# Patient Record
Sex: Female | Born: 1990 | Hispanic: Yes | Marital: Single | Smoking: Never smoker
Health system: Southern US, Community
[De-identification: ages and names within clinical notes are randomized; demographics above are authoritative.]

---

## 2019-05-05 ENCOUNTER — Emergency Department: Payer: Self-pay

## 2019-05-05 ENCOUNTER — Other Ambulatory Visit: Payer: Self-pay

## 2019-05-05 ENCOUNTER — Emergency Department
Admission: EM | Admit: 2019-05-05 | Discharge: 2019-05-05 | Disposition: A | Discharge status: Home / Self Care | Payer: Self-pay | Attending: Student in an Organized Health Care Education/Training Program | Admitting: Student in an Organized Health Care Education/Training Program

## 2019-05-05 DIAGNOSIS — R55 Syncope and collapse: Secondary | ICD-10-CM | POA: Insufficient documentation

## 2019-05-05 LAB — CBC WITH DIFFERENTIAL/PLATELET
Abs Immature Granulocytes: 0.01 10*3/uL (ref 0.00–0.07)
Basophils Absolute: 0.1 10*3/uL (ref 0.0–0.1)
Basophils Relative: 1 %
Eosinophils Absolute: 0.1 10*3/uL (ref 0.0–0.5)
Eosinophils Relative: 2 %
HCT: 41.3 % (ref 36.0–46.0)
Hemoglobin: 13.6 g/dL (ref 12.0–15.0)
Immature Granulocytes: 0 %
Lymphocytes Relative: 20 %
Lymphs Abs: 1.6 10*3/uL (ref 0.7–4.0)
MCH: 28.8 pg (ref 26.0–34.0)
MCHC: 32.9 g/dL (ref 30.0–36.0)
MCV: 87.3 fL (ref 80.0–100.0)
Monocytes Absolute: 0.5 10*3/uL (ref 0.1–1.0)
Monocytes Relative: 6 %
Neutro Abs: 5.7 10*3/uL (ref 1.7–7.7)
Neutrophils Relative %: 71 %
Platelets: 246 10*3/uL (ref 150–400)
RBC: 4.73 MIL/uL (ref 3.87–5.11)
RDW: 13.3 % (ref 11.5–15.5)
WBC: 8 10*3/uL (ref 4.0–10.5)
nRBC: 0 % (ref 0.0–0.2)

## 2019-05-05 LAB — COMPREHENSIVE METABOLIC PANEL
ALT: 14 U/L (ref 0–44)
AST: 19 U/L (ref 15–41)
Albumin: 4 g/dL (ref 3.5–5.0)
Alkaline Phosphatase: 41 U/L (ref 38–126)
Anion gap: 6 (ref 5–15)
BUN: 16 mg/dL (ref 6–20)
CO2: 26 mmol/L (ref 22–32)
Calcium: 8.9 mg/dL (ref 8.9–10.3)
Chloride: 105 mmol/L (ref 98–111)
Creatinine, Ser: 0.6 mg/dL (ref 0.44–1.00)
GFR calc Af Amer: >60 mL/min (ref >60)
GFR calc non Af Amer: >60 mL/min (ref >60)
Glucose, Bld: 111 mg/dL — ABNORMAL HIGH (ref 70–99)
Potassium: 4 mmol/L (ref 3.5–5.1)
Sodium: 137 mmol/L (ref 135–145)
Total Bilirubin: 0.7 mg/dL (ref 0.3–1.2)
Total Protein: 7.2 g/dL (ref 6.5–8.1)

## 2019-05-05 LAB — GLUCOSE, CAPILLARY: Glucose-Capillary: 111 mg/dL — ABNORMAL HIGH (ref 70–99)

## 2019-05-05 MED ORDER — SODIUM CHLORIDE 0.9 % IV BOLUS
1000.0000 mL | Freq: Once | INTRAVENOUS | Status: AC
  Administered 2019-05-05: 1000 mL via INTRAVENOUS

## 2019-05-05 NOTE — Discharge Instructions (Signed)
Do not operate heavy machinery or operate a vehicle until you have been seen and cleared to do so by PCP or neurology.

## 2019-05-05 NOTE — ED Notes (Signed)
POC Urine pregnancy is Negative. Unable to upload results due to barcodes not scanning.

## 2019-05-05 NOTE — ED Notes (Signed)
Pt states coming in after she had LOC at 05:30. Pt states some pain to the face. Pt is alert and oriented x4. Pt states history of syncopal episodes.

## 2019-05-05 NOTE — ED Notes (Signed)
Pt reports that syncopal episodes always occur first thing in the morning and never during the day. No hx of diabetes. Pt states this morning she got dizzy, nauseated, sat down and woke up on the floor. Pt has a red abrasion to the left forehead and the bridge of the nose from the fall.

## 2019-05-05 NOTE — ED Provider Notes (Signed)
[facility] Emergency Department Provider Note    First MD Initiated Contact with Patient 05/05/19 1730     (approximate)  I have reviewed the triage vital signs and the nursing notes.   HISTORY  Chief Complaint Loss of Consciousness    HPI Loretta Roberts is a 29 y.o. female   with a history of fainting spells presents to the ER from urgent care via EMS due to fainting spell that occurred this morning.  States that she was getting up out of bed and started feeling some nausea and was feeling lightheaded.  She then stood up quickly out of bed started seeing spots and then as if her vision was becoming tunnel vision.  She then fell to the ground losing consciousness briefly.  She woke up shortly thereafter.  Did realize that she had lost control of her bladder.  Did not bite her tongue.  Was not complaining of any muscle pain or aches.  Did have some contusion to her nose.  States that similar stuff has happened in the past.  [place] so has not established with any providers.  Denies any history of seizure disorder.   History reviewed. No pertinent past medical history. No family history on file. History reviewed. No pertinent surgical history. There are no problems to display for this patient.     Prior to Admission medications   Not on File    Allergies Patient has no allergy information on record.    Social History Social History   Tobacco Use  . Smoking status: Never Smoker  . Smokeless tobacco: Never Used  Substance Use Topics  . Alcohol use: Not Currently  . Drug use: Never    Review of Systems Patient denies headaches, rhinorrhea, blurry vision, numbness, shortness of breath, chest pain, edema, cough, abdominal pain, nausea, vomiting, diarrhea, dysuria, fevers, rashes or hallucinations unless otherwise stated above in HPI. ____________________________________________   PHYSICAL EXAM:  VITAL  SIGNS: Vitals:   05/05/19 1800 05/05/19 1940  BP: 111/71 110/72  Pulse: 75 77  Resp: 13 19  Temp:    SpO2: 100% 100%    Constitutional: Alert and oriented.  Eyes: Conjunctivae are normal.  Head: Superficial abrasion to left forehead.  Does have some ecchymosis over the bridge of the nose.  No deformity.  No septal hematoma no hemotympanum Nose: No congestion/rhinnorhea. Mouth/Throat: Mucous membranes are moist.   Neck: No stridor. Painless ROM.  Cardiovascular: Normal rate, regular rhythm. Grossly normal heart sounds.  Good peripheral circulation. Respiratory: Normal respiratory effort.  No retractions. Lungs CTAB. Gastrointestinal: Soft and nontender. No distention. No abdominal bruits. No CVA tenderness. Genitourinary:  Musculoskeletal: No lower extremity tenderness nor edema.  No joint effusions. Neurologic: CN- intact.  No facial droop, Normal FNF.  Normal heel to shin.  Sensation intact bilaterally. Normal speech and language. No gross focal neurologic deficits are appreciated. No gait instability. Skin:  Skin is warm, dry and intact. No rash noted. Psychiatric: Mood and affect are normal. Speech and behavior are normal.  ____________________________________________   LABS (all labs ordered are listed, but only abnormal results are displayed)  Results for orders placed or performed during the hospital encounter of 05/05/19 (from the past 24 hour(s))  CBC with Differential/Platelet     Status: None   Collection Time: 05/05/19  5:21 PM  Result Value Ref Range   WBC 8.0 4.0 - 10.5 K/uL   RBC 4.73 3.87 - 5.11 MIL/uL  Hemoglobin 13.6 12.0 - 15.0 g/dL   HCT 16.1 09.6 - 04.5 %   MCV 87.3 80.0 - 100.0 fL   MCH 28.8 26.0 - 34.0 pg   MCHC 32.9 30.0 - 36.0 g/dL   RDW 40.9 81.1 - 91.4 %   Platelets 246 150 - 400 K/uL   nRBC 0.0 0.0 - 0.2 %   Neutrophils Relative % 71 %   Neutro Abs 5.7 1.7 - 7.7 K/uL   Lymphocytes Relative 20 %   Lymphs Abs 1.6 0.7 - 4.0 K/uL   Monocytes  Relative 6 %   Monocytes Absolute 0.5 0.1 - 1.0 K/uL   Eosinophils Relative 2 %   Eosinophils Absolute 0.1 0.0 - 0.5 K/uL   Basophils Relative 1 %   Basophils Absolute 0.1 0.0 - 0.1 K/uL   Immature Granulocytes 0 %   Abs Immature Granulocytes 0.01 0.00 - 0.07 K/uL  Comprehensive metabolic panel     Status: Abnormal   Collection Time: 05/05/19  5:21 PM  Result Value Ref Range   Sodium 137 135 - 145 mmol/L   Potassium 4.0 3.5 - 5.1 mmol/L   Chloride 105 98 - 111 mmol/L   CO2 26 22 - 32 mmol/L   Glucose, Bld 111 (H) 70 - 99 mg/dL   BUN 16 6 - 20 mg/dL   Creatinine, Ser 7.82 0.44 - 1.00 mg/dL   Calcium 8.9 8.9 - 95.6 mg/dL   Total Protein 7.2 6.5 - 8.1 g/dL   Albumin 4.0 3.5 - 5.0 g/dL   AST 19 15 - 41 U/L   ALT 14 0 - 44 U/L   Alkaline Phosphatase 41 38 - 126 U/L   Total Bilirubin 0.7 0.3 - 1.2 mg/dL   GFR calc non Af Amer >60 >60 mL/min   GFR calc Af Amer >60 >60 mL/min   Anion gap 6 5 - 15  Glucose, capillary     Status: Abnormal   Collection Time: 05/05/19  5:27 PM  Result Value Ref Range   Glucose-Capillary 111 (H) 70 - 99 mg/dL   ____________________________________________  EKG My review and personal interpretation at Time: 17:19   Indication: syncope  Rate: 80  Rhythm: sinus Axis: normal Other: normal intervals, no stemi no wpw or brugada ____________________________________________  RADIOLOGY  I personally reviewed all radiographic images ordered to evaluate for the above acute complaints and reviewed radiology reports and findings.  These findings were personally discussed with the patient.  Please see medical record for radiology report.  ____________________________________________   PROCEDURES  Procedure(s) performed:  Procedures    Critical Care performed: no ____________________________________________   INITIAL IMPRESSION / ASSESSMENT AND PLAN / ED COURSE  Pertinent labs & imaging results that were available during my care of the patient were  reviewed by me and considered in my medical decision making (see chart for details).   DDX: Dysrhythmia, anemia, electrolyte abnormality, ACS, seizure, ectopic  Cniyah Sproull is a 29 y.o. who presents to the ED with symptoms as described above.  Patient is currently clinically very well-appearing in no acute distress after a syncopal episode that occurred this morning around 5:00.  Were now nearly 12 hours after the episode is brought in by EMS from urgent care.  Work-up initiated shows normal vital signs reassuring exam as well as reassuring imaging.  I will lower suspicion for seizure as she is reporting feeling nauseated and lightheaded prior to this event.  States that the symptoms occurred after she quickly stood up out of  bed where she then started seeing spots and then felt like her vision was developing tunnel vision.  She then woke up on the floor.  She was given IV fluids.  She is tolerating p.o.  She is not a diabetic.  Certainly no signs of status.  At this point believe she is cleared and appropriate for outpatient follow-up.     The patient was evaluated in Emergency Department today for the symptoms described in the history of present illness. He/she was evaluated in the context of the global COVID-19 pandemic, which necessitated consideration that the patient might be at risk for infection with the SARS-CoV-2 virus that causes COVID-19. Institutional protocols and algorithms that pertain to the evaluation of patients at risk for COVID-19 are in a state of rapid change based on information released by regulatory bodies including the CDC and federal and state organizations. These policies and algorithms were followed during the patient's care in the ED.  As part of my medical decision making, I reviewed the following data within the electronic MEDICAL RECORD NUMBER Nursing notes reviewed and incorporated, Labs reviewed, notes from prior ED visits and [place] Controlled Substance  Database   ____________________________________________   FINAL CLINICAL IMPRESSION(S) / ED DIAGNOSES  Final diagnoses:  Syncope and collapse      NEW MEDICATIONS STARTED DURING THIS VISIT:  New Prescriptions   No medications on file     Note:  This document was prepared using Dragon voice recognition software and may include unintentional dictation errors.    Willy Eddy, MD 05/05/19 [phone]

## 2019-05-05 NOTE — ED Triage Notes (Signed)
Pt to the er via EMS from [facility]. Pt had an episode this morning of LOC which is not uncommon for her however this morning she had loss of bladder which is not the norm. [facility] suspects a seizure. Pt has had no further incidents today. No hx of seizures. VSS with EMS.

## 2021-01-02 IMAGING — CT CT MAXILLOFACIAL W/O CM
3 of 4 series · 15 of 47 positions shown, 18 images · non-contrast
Comparison: None.

CLINICAL DATA: Recent syncopal episode with nasal injury, initial
encounter

EXAM:
CT HEAD WITHOUT CONTRAST
CT MAXILLOFACIAL WITHOUT CONTRAST
TECHNIQUE: Multidetector CT imaging of the head and maxillofacial structures
were performed using the standard protocol without intravenous
contrast. Multiplanar CT image reconstructions of the maxillofacial
structures were also generated.

[Series 2: max soft (person_name) · axial · 0.32mm/px · z∈[+262,+394]mm · 10 of 78 slices shown, 13 images]
[im 6/78  brain]
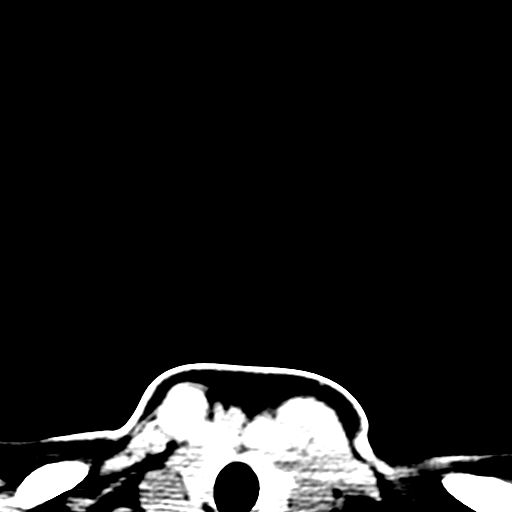
[im 6/78  bone]
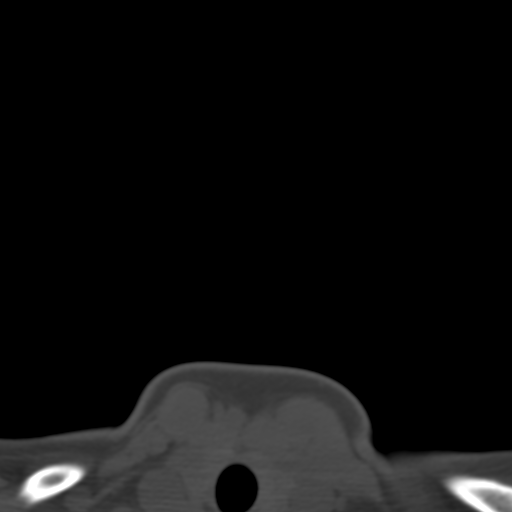
[im 14/78  bone]
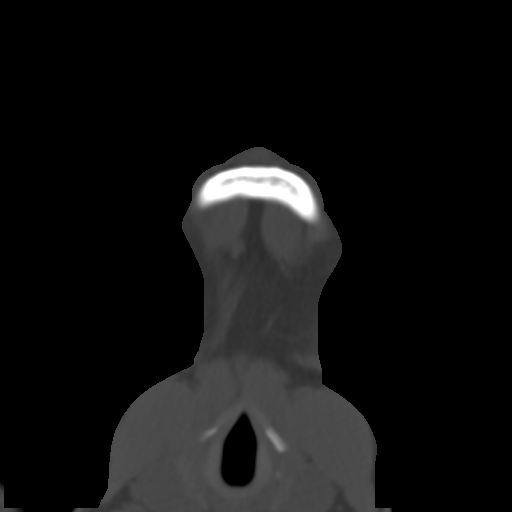
[im 22/78  bone]
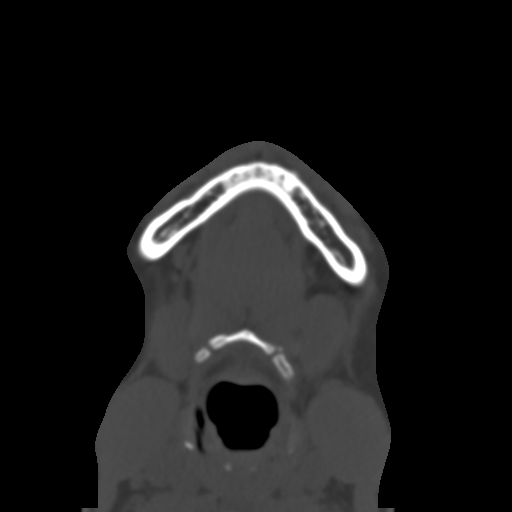
[im 27/78  bone]
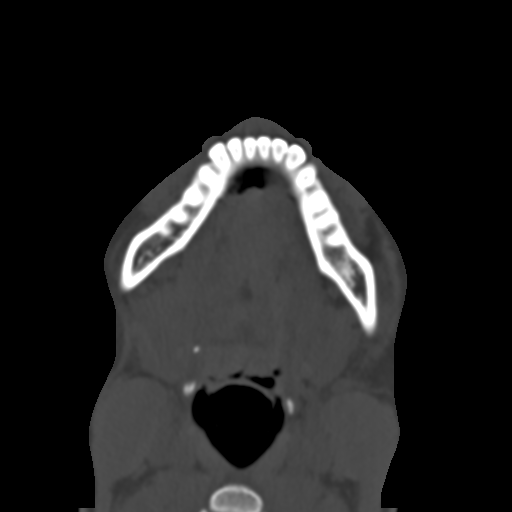
[im 35/78  brain]
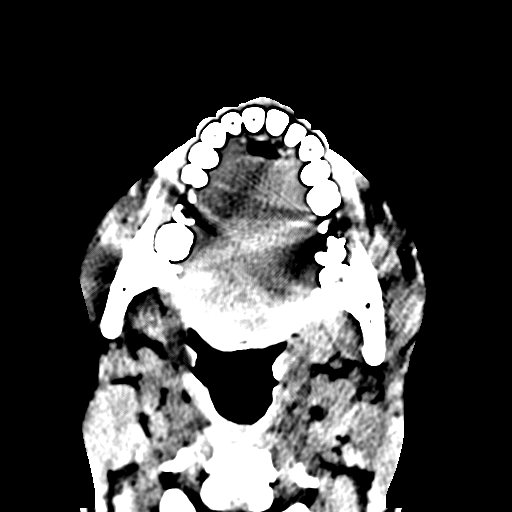
[im 35/78  bone]
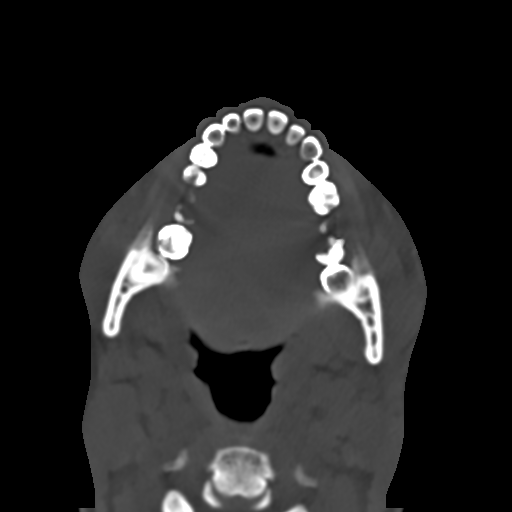
[im 43/78  bone]
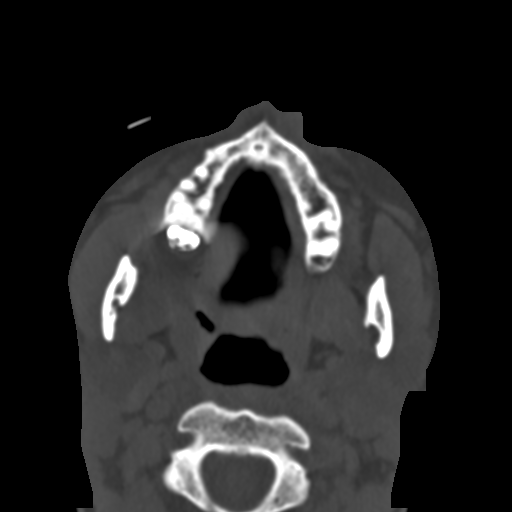
[im 51/78  bone]
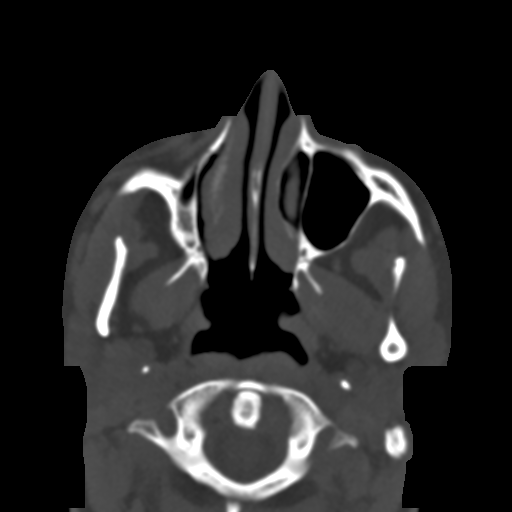
[im 59/78  bone]
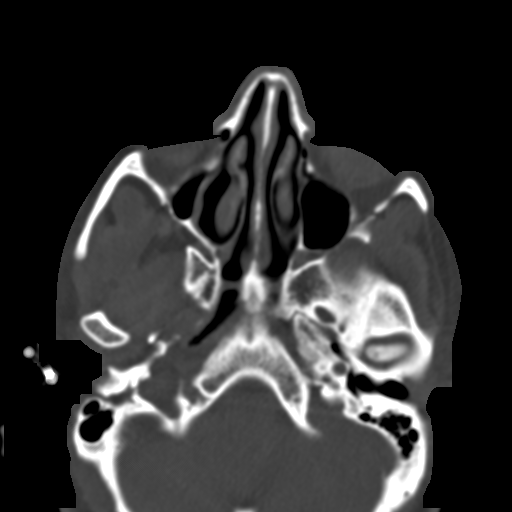
[im 64/78  brain]
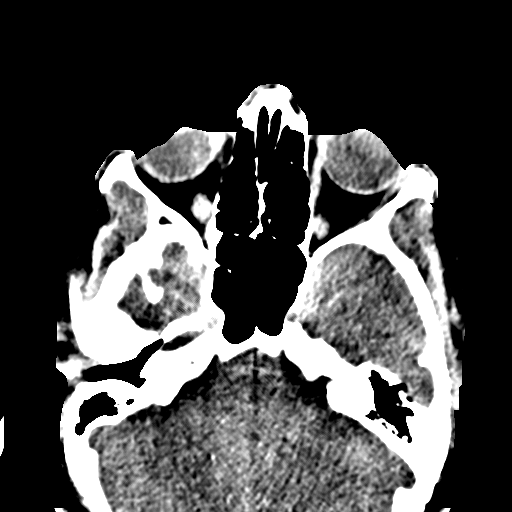
[im 64/78  bone]
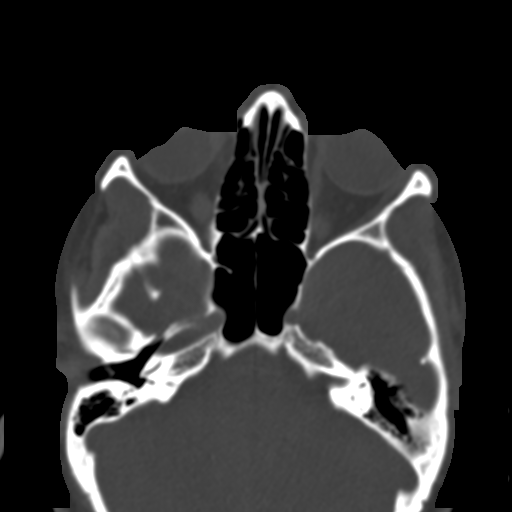
[im 72/78  bone]
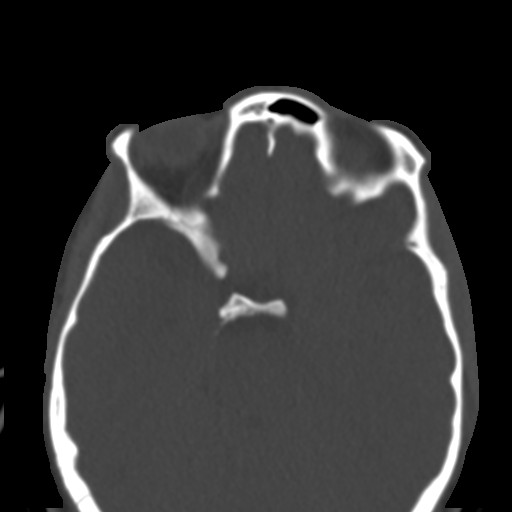

[Series 6: coronal soft · coronal · 0.30mm/px · 3 of 113 slices shown]
[im 38/113  bone]
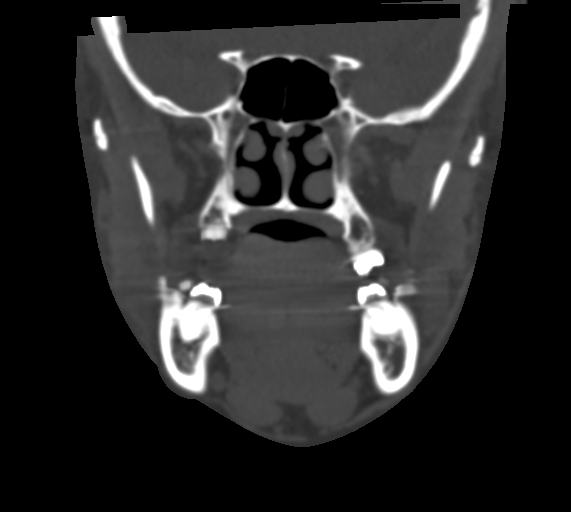
[im 50/113  bone]
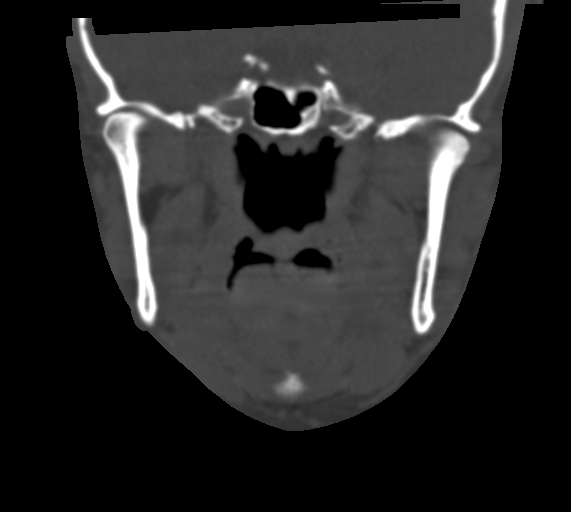
[im 63/113  bone]
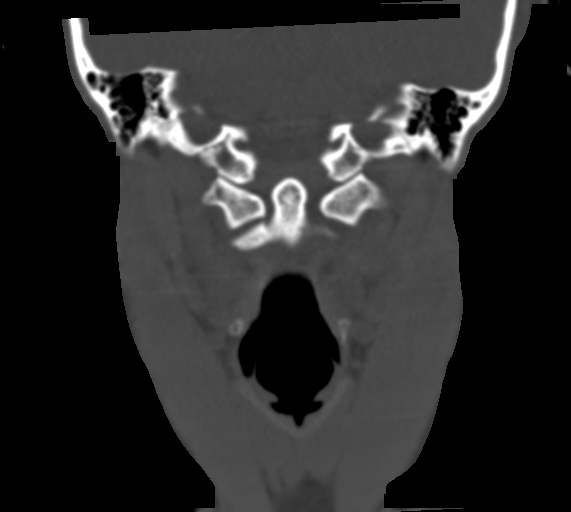

[Series 9: sagittal bone · sagittal · 0.30mm/px · 2 of 88 slices shown]
[im 30/88  bone]
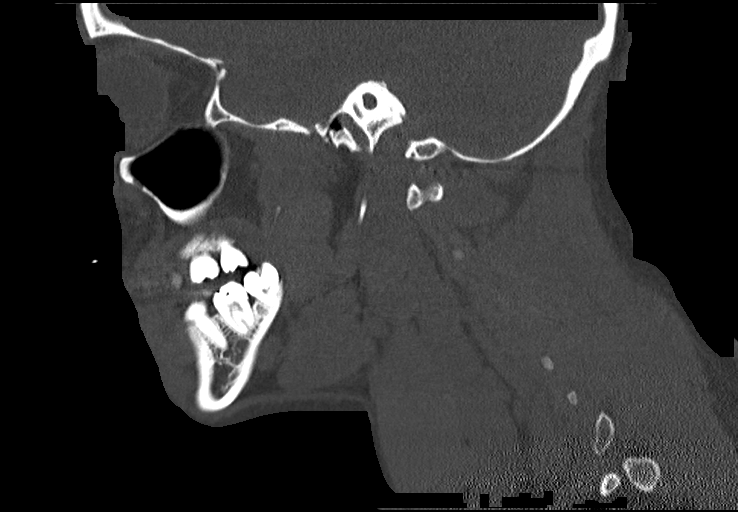
[im 59/88  bone]
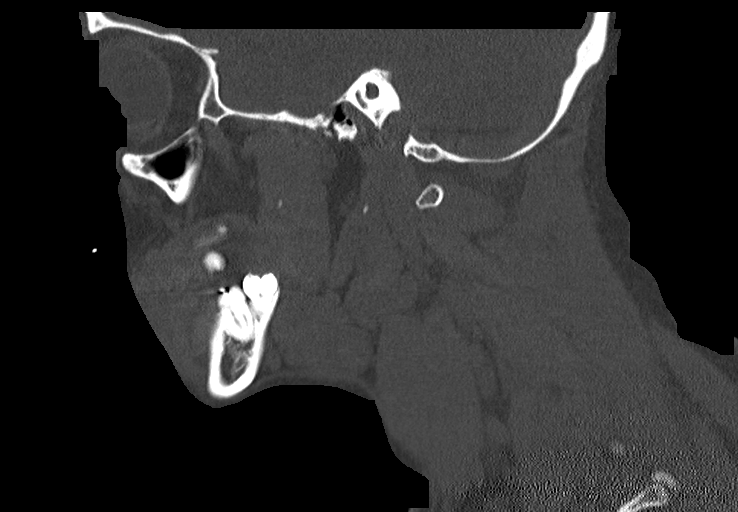

[15 of 47 positions shown; findings below may reference images not displayed]

FINDINGS: CT HEAD FINDINGS

Brain: No evidence of acute infarction, hemorrhage, hydrocephalus,
extra-axial collection or mass lesion/mass effect.

Vascular: No hyperdense vessel or unexpected calcification.

Skull: Normal. Negative for fracture or focal lesion.

Other: None.

CT MAXILLOFACIAL FINDINGS

Osseous: No acute fracture is identified.

Orbits: Negative. No traumatic or inflammatory finding.

Sinuses: Clear.

Soft tissues: No significant soft tissue abnormality is noted.
IMPRESSION: CT of the head: Normal head CT for age.

CT of the maxillofacial bones: No acute bony abnormality is noted.

## 2021-01-02 IMAGING — CT CT HEAD W/O CM
3 series · 16 of 47 positions shown, 19 images · non-contrast
Comparison: None.

CLINICAL DATA: Recent syncopal episode with nasal injury, initial
encounter

EXAM:
CT HEAD WITHOUT CONTRAST
CT MAXILLOFACIAL WITHOUT CONTRAST
TECHNIQUE: Multidetector CT imaging of the head and maxillofacial structures
were performed using the standard protocol without intravenous
contrast. Multiplanar CT image reconstructions of the maxillofacial
structures were also generated.

[Series 2: head wo · axial · 0.39mm/px · z∈[+381,+506]mm · 10 of 30 slices shown, 13 images]
[im 3/30  brain]
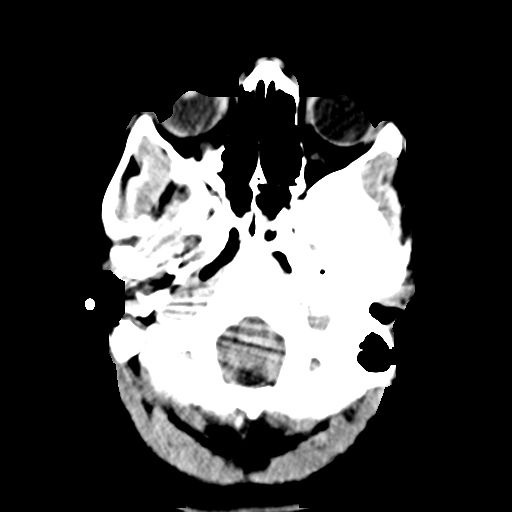
[im 3/30  bone]
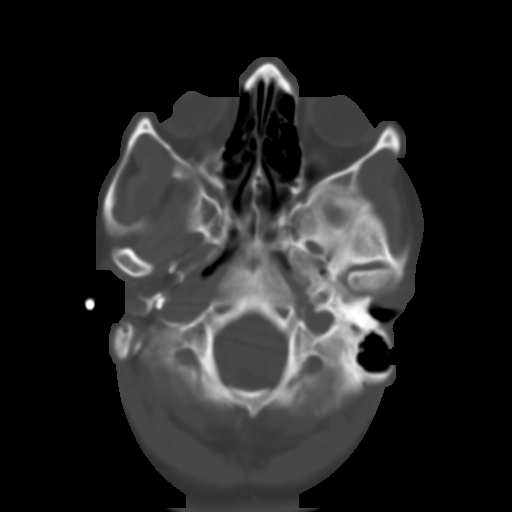
[im 6/30  brain]
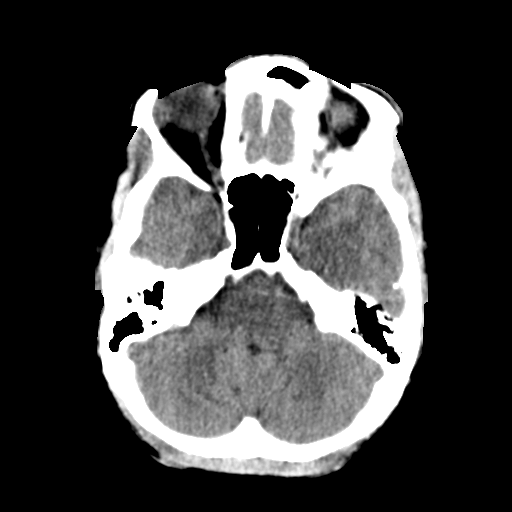
[im 9/30  brain]
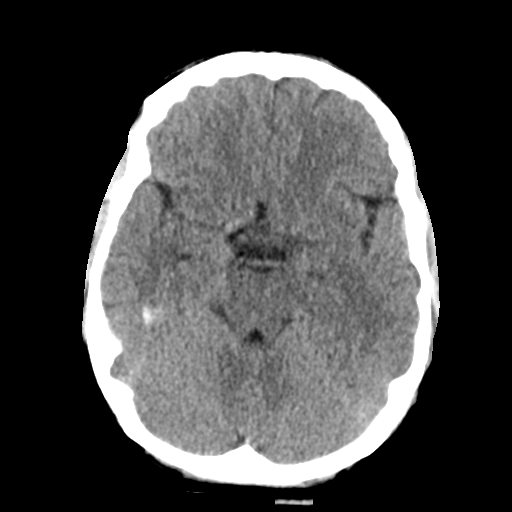
[im 11/30  brain]
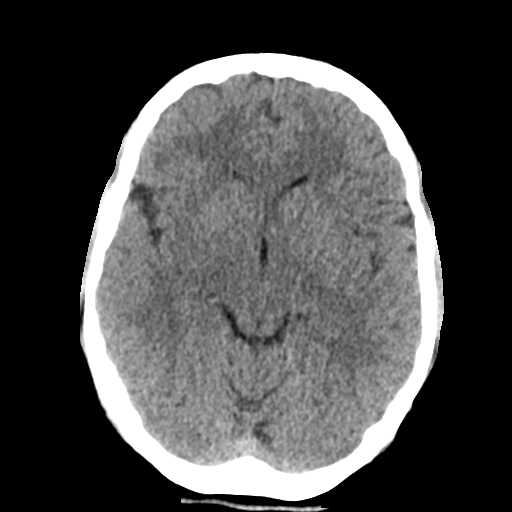
[im 14/30  brain]
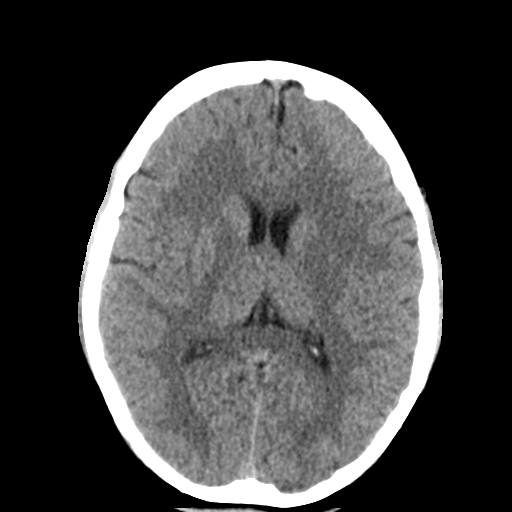
[im 14/30  bone]
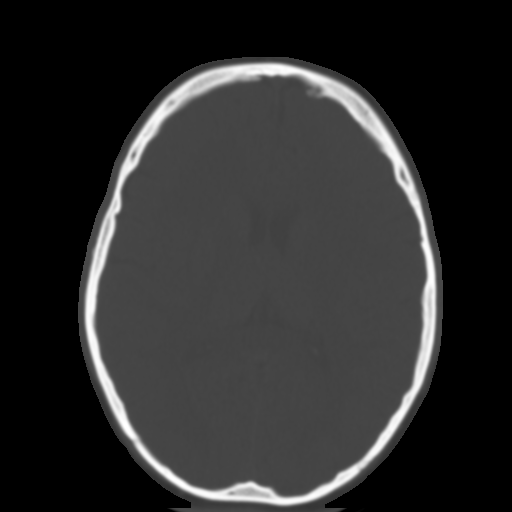
[im 17/30  brain]
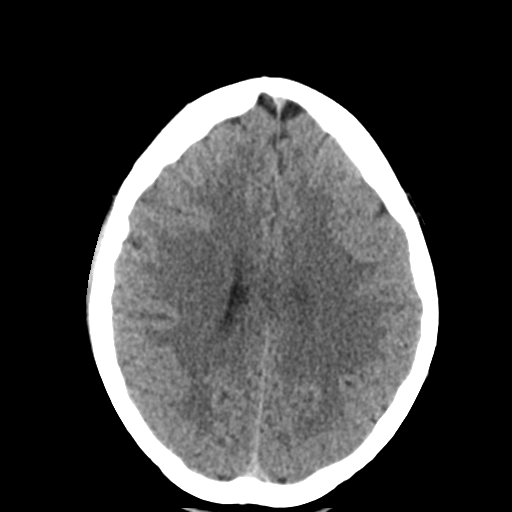
[im 20/30  brain]
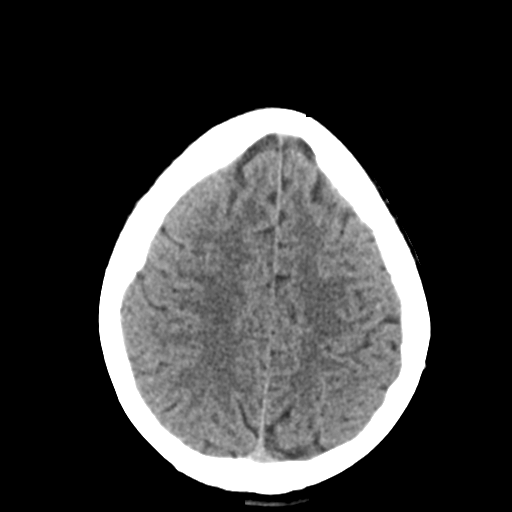
[im 23/30  brain]
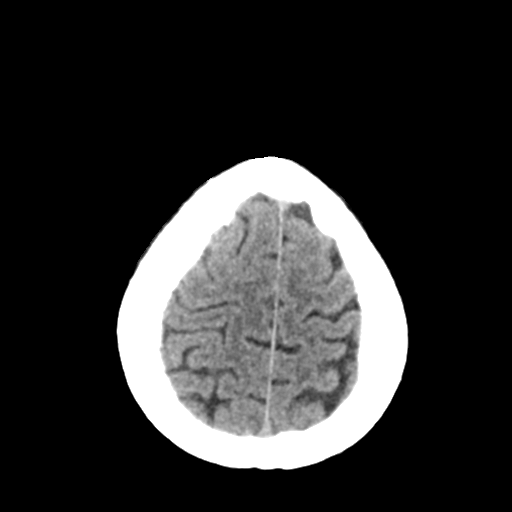
[im 25/30  brain]
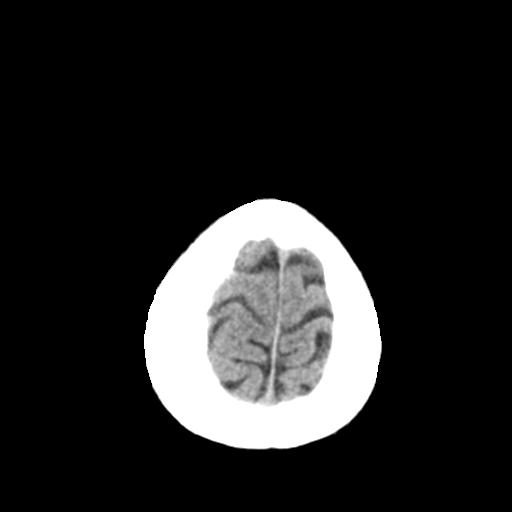
[im 25/30  bone]
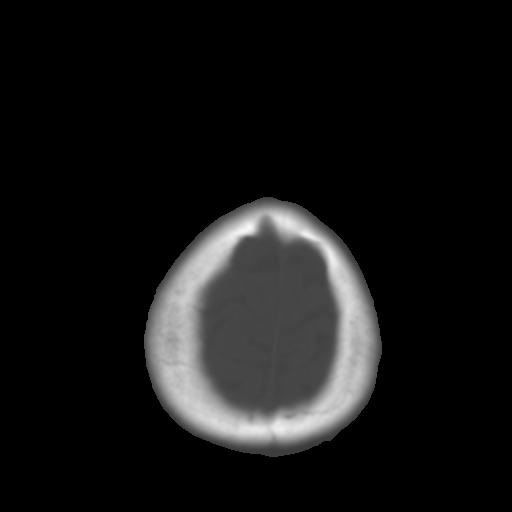
[im 28/30  brain]
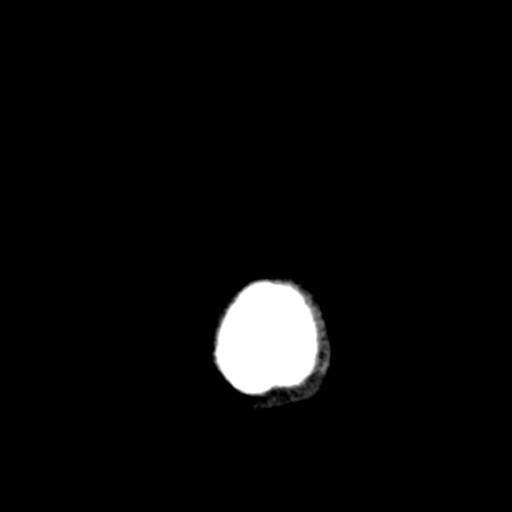

[Series 4: coronal soft tissue · coronal · 0.30mm/px · 3 of 62 slices shown]
[im 21/62  brain]
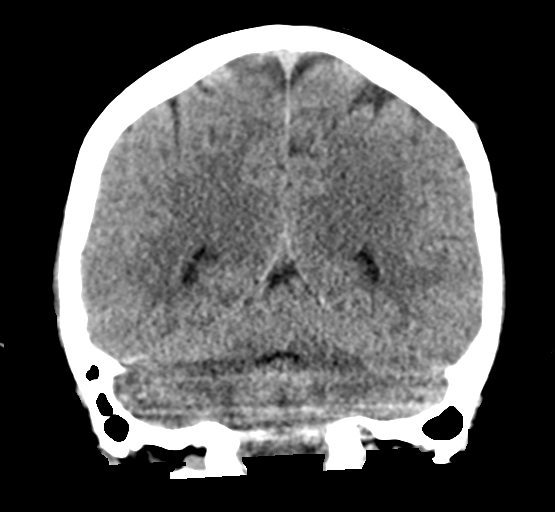
[im 28/62  brain]
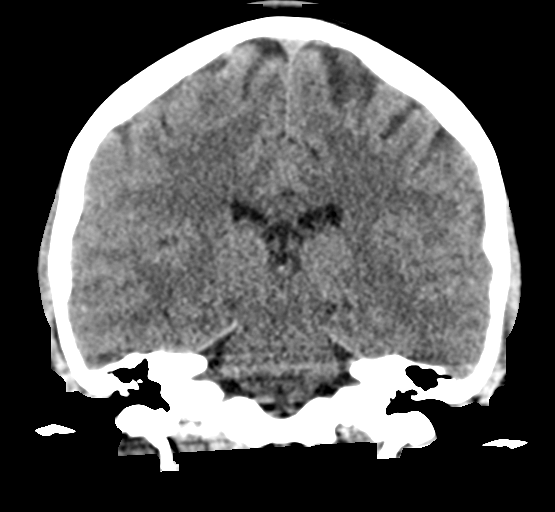
[im 34/62  brain]
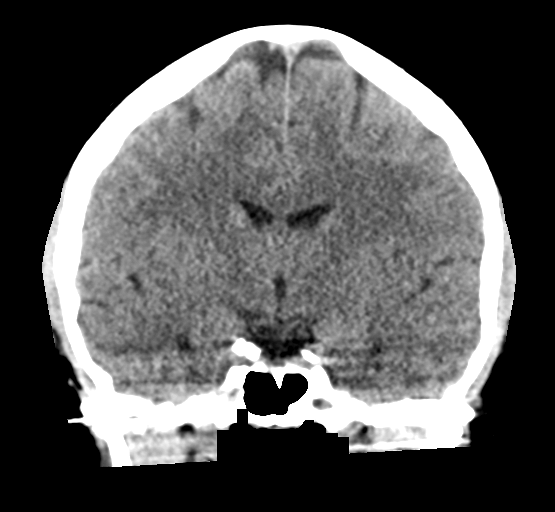

[Series 5: sagittal soft tissue · sagittal · 0.30mm/px · 3 of 55 slices shown]
[im 19/55  brain]
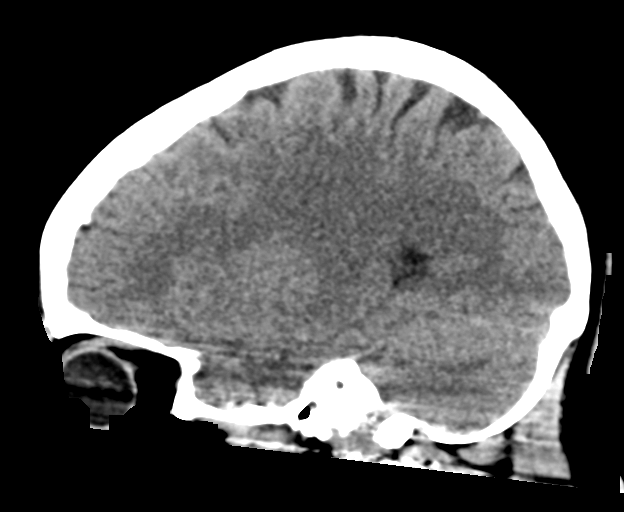
[im 28/55  brain]
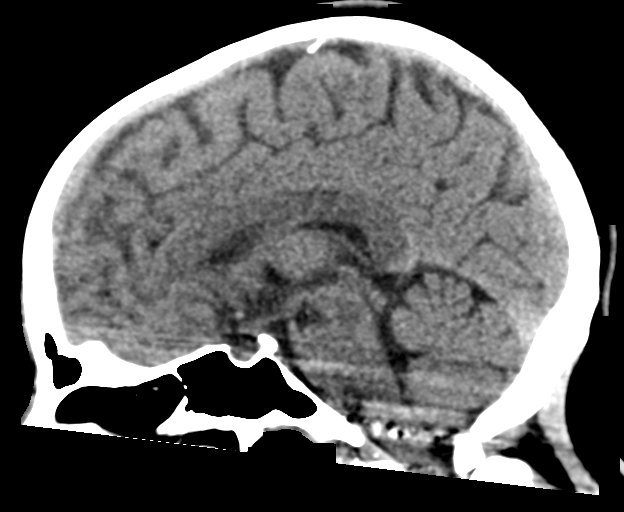
[im 37/55  brain]
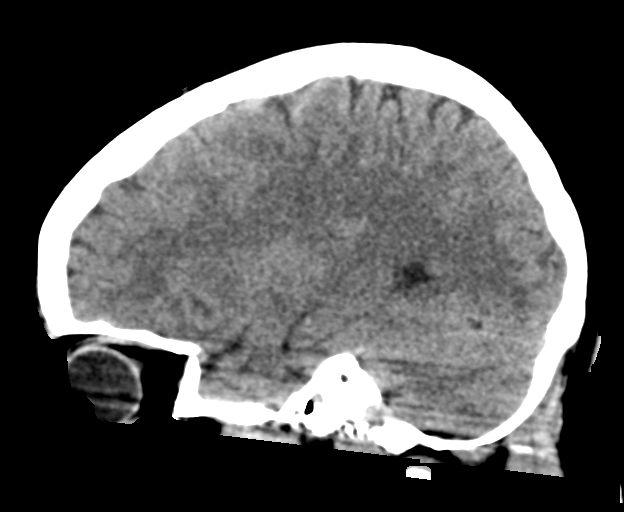

[16 of 47 positions shown; findings below may reference images not displayed]

FINDINGS: CT HEAD FINDINGS

Brain: No evidence of acute infarction, hemorrhage, hydrocephalus,
extra-axial collection or mass lesion/mass effect.

Vascular: No hyperdense vessel or unexpected calcification.

Skull: Normal. Negative for fracture or focal lesion.

Other: None.

CT MAXILLOFACIAL FINDINGS

Osseous: No acute fracture is identified.

Orbits: Negative. No traumatic or inflammatory finding.

Sinuses: Clear.

Soft tissues: No significant soft tissue abnormality is noted.
IMPRESSION: CT of the head: Normal head CT for age.

CT of the maxillofacial bones: No acute bony abnormality is noted.
